# Patient Record
Sex: Male | Born: 1990 | Race: White | Hispanic: No | Marital: Single
Health system: Southern US, Community
[De-identification: ages and names within clinical notes are randomized; demographics above are authoritative.]

---

## 2021-11-04 ENCOUNTER — Emergency Department (HOSPITAL_COMMUNITY)
Admission: EM | Admit: 2021-11-04 | Discharge: 2021-11-04 | Disposition: A | Discharge status: Home / Self Care | Payer: No Typology Code available for payment source | Attending: Emergency Medicine | Admitting: Emergency Medicine

## 2021-11-04 ENCOUNTER — Emergency Department (HOSPITAL_COMMUNITY): Payer: No Typology Code available for payment source

## 2021-11-04 ENCOUNTER — Encounter (HOSPITAL_COMMUNITY): Payer: Self-pay

## 2021-11-04 ENCOUNTER — Other Ambulatory Visit: Payer: Self-pay

## 2021-11-04 DIAGNOSIS — Z20822 Contact with and (suspected) exposure to covid-19: Secondary | ICD-10-CM | POA: Insufficient documentation

## 2021-11-04 DIAGNOSIS — R059 Cough, unspecified: Secondary | ICD-10-CM | POA: Diagnosis present

## 2021-11-04 DIAGNOSIS — J029 Acute pharyngitis, unspecified: Secondary | ICD-10-CM

## 2021-11-04 DIAGNOSIS — J028 Acute pharyngitis due to other specified organisms: Secondary | ICD-10-CM | POA: Diagnosis not present

## 2021-11-04 DIAGNOSIS — B9789 Other viral agents as the cause of diseases classified elsewhere: Secondary | ICD-10-CM | POA: Diagnosis not present

## 2021-11-04 DIAGNOSIS — J069 Acute upper respiratory infection, unspecified: Secondary | ICD-10-CM | POA: Insufficient documentation

## 2021-11-04 LAB — RESP PANEL BY RT-PCR (FLU A&B, COVID) ARPGX2
Influenza A by PCR: NEGATIVE
Influenza B by PCR: NEGATIVE
SARS Coronavirus 2 by RT PCR: NEGATIVE

## 2021-11-04 LAB — GROUP A STREP BY PCR: Group A Strep by PCR: NOT DETECTED

## 2021-11-04 MED ORDER — IBUPROFEN 600 MG PO TABS: 600.0000 mg | ORAL_TABLET | Freq: Four times a day (QID) | ORAL | 0 refills | Status: DC | PRN

## 2021-11-04 NOTE — ED Notes (Signed)
I provided reinforced discharge education based off of discharge instructions. Pt acknowledged and understood my education. Pt had no further questions/concerns for provider/myself.  °

## 2021-11-04 NOTE — ED Provider Notes (Signed)
[facility]-EMERGENCY DEPT Provider Note   CSN: [id number] Arrival date & time: 11/04/21  0536     History  Chief Complaint  Patient presents with   Sore Throat   Cough    [facility] is a 31 y.o. male who presents to the ED complaining of sore throat and cough onset 6 days.  Denies sick contacts.  Has tried over-the-counter cough and cold medications with mild relief of his symptoms.  Denies chest pain, shortness of breath, trouble swallowing, fever, chills, nausea, vomiting. Patient is a he is in sober living and has been in [place] since 10/21/2021.  The history is provided by the patient. No language interpreter was used.      Home Medications Prior to Admission medications   Not on File      Allergies    Patient has no allergy information on record.    Review of Systems   Review of Systems  Constitutional:  Negative for chills and fever.  HENT:  Positive for congestion, rhinorrhea and sore throat. Negative for trouble swallowing.   Respiratory:  Positive for cough. Negative for shortness of breath.   Gastrointestinal:  Negative for nausea and vomiting.  All other systems reviewed and are negative.  Physical Exam Updated Vital Signs BP 115/90 (BP Location: Left Arm)    Pulse 69    Temp 98.7 F (37.1 C) (Oral)    Resp 17    Ht 5\' 9"  (1.753 m)    Wt 68 kg    SpO2 100%    BMI 22.15 kg/m  Physical Exam Vitals and nursing note reviewed.  Constitutional:      General: He is not in acute distress.    Appearance: He is not diaphoretic.  HENT:     Head: Normocephalic and atraumatic.     Mouth/Throat:     Mouth: Mucous membranes are moist.     Pharynx: Oropharynx is clear. Uvula midline. No pharyngeal swelling, oropharyngeal exudate, posterior oropharyngeal erythema or uvula swelling.     Tonsils: No tonsillar exudate or tonsillar abscesses.     Comments: Patent airway. Uvula midline without swelling. No posterior pharyngeal  erythema or exudate noted.  Eyes:     General: No scleral icterus.    Conjunctiva/sclera: Conjunctivae normal.  Cardiovascular:     Rate and Rhythm: Normal rate and regular rhythm.     Pulses: Normal pulses.     Heart sounds: Normal heart sounds.  Pulmonary:     Effort: Pulmonary effort is normal. No respiratory distress.     Breath sounds: Normal breath sounds. No wheezing.  Chest:     Chest wall: Tenderness present.     Comments: Right sided chest wall TTP. No obvious deformity noted. Abdominal:     General: Bowel sounds are normal.     Palpations: Abdomen is soft. There is no mass.     Tenderness: There is no abdominal tenderness. There is no guarding or rebound.  Musculoskeletal:        General: Normal range of motion.     Cervical back: Normal range of motion and neck supple.  Skin:    General: Skin is warm and dry.  Neurological:     Mental Status: He is alert.  Psychiatric:        Behavior: Behavior normal.    ED Results / Procedures / Treatments   Labs (all labs ordered are listed, but only abnormal results are displayed) Labs Reviewed  RESP PANEL BY  RT-PCR (FLU A&B, COVID) ARPGX2  GROUP A STREP BY PCR    EKG None  Radiology DG Chest 2 View  Result Date: 11/04/2021 CLINICAL DATA:  31 year old male with sore throat and cough for 5 days. EXAM: CHEST - 2 VIEW COMPARISON:  None. FINDINGS: Lung volumes are at the upper limits of normal. Normal cardiac size and mediastinal contours. Visualized tracheal air column is within normal limits. Lung markings are symmetric and within normal limits. Both lungs appear clear. No pneumothorax or pleural effusion. No osseous abnormality identified.  Negative visible bowel gas. IMPRESSION: Negative, no cardiopulmonary abnormality. Electronically Signed   By: Odessa Fleming M.D.   On: 11/04/2021 09:55    Procedures Procedures    Medications Ordered in ED Medications - No data to display  ED Course/ Medical Decision Making/  A&P Clinical Course as of 11/04/21 1013  Tue Nov 04, 2021  1003 Pt notified of negative chest xray finding in the ED. Discussed with patient that he can continue with OTC medications as previous. Sent Rx for ibuprofen. Pt agreeable with discharge treatment plan. Pt appears safe for discharge. [SB]    Clinical Course User Index [SB] Dijon Kohlman A, PA-C                           Medical Decision Making Amount and/or Complexity of Data Reviewed Radiology: ordered.  Risk Prescription drug management.   Patient with sore throat, rhinorrhea, nasal congestion, and cough x 6 days  Denies sick contacts. Has tried OTC medications with relief of his symptoms. Vital signs stable, pt afebrile, not tachycardic, or hypoxic. On exam, patient with patent airway, uvula midline without swelling. No tonsillar exudate. No acute cardiovascular, respiratory, or abdominal exam findings. Differential diagnosis includes COVID, flu, pneumonia, strep pharyngitis, viral URI with cough, viral pharyngitis.    Labs:  I ordered, and personally interpreted labs.  The pertinent results include:  COVID, flu, strep swab negative  Imaging: I ordered imaging studies including chest x-ray I independently visualized and interpreted imaging which showed no acute cardiopulmonary findings I agree with the radiologist interpretation   Disposition: Patient presentation suspicious for viral URI with cough as well as viral pharyngitis.  Doubt COVID, flu, pneumonia at this time.  After consideration of the diagnostic results and the patients response to treatment, I feel that the patient would benefit from discharge home with continuation of over-the-counter medications as needed for symptom management. Supportive care measures and strict return precautions discussed with patient at bedside. Pt acknowledges and verbalizes understanding. Pt appears safe for discharge. Follow up as indicated in discharge paperwork.    This chart  was dictated using voice recognition software, Dragon. Despite the best efforts of this provider to proofread and correct errors, errors may still occur which can change documentation meaning.   Final Clinical Impression(s) / ED Diagnoses Final diagnoses:  Viral URI with cough  Viral pharyngitis    Rx / DC Orders ED Discharge Orders     None         Jonda Alanis A, PA-C 11/04/21 1013    Pricilla Loveless, MD 11/05/21 [phone]

## 2021-11-04 NOTE — ED Triage Notes (Signed)
Pt reports with sore throat and cough since Thursday.

## 2021-11-04 NOTE — Discharge Instructions (Addendum)
It was a pleasure taking care of you!   Your COVID, flu, strep swabs were negative in the ED.  Your chest xray didn't show pneumonia in the ED. You may take over the counter 600 mg Ibuprofen every 6 hours or 500 mg Tylenol every 6 hours as needed for pain for no more than 7 days. Ensure to maintain fluid intake. Return to the Emergency Department if you are experiencing trouble breathing, worsening or increasing chest pain, decreased fluid intake or worsening symptoms.

## 2022-03-26 ENCOUNTER — Encounter (HOSPITAL_COMMUNITY): Payer: Self-pay | Admitting: Emergency Medicine

## 2022-03-26 ENCOUNTER — Emergency Department (HOSPITAL_COMMUNITY)
Admission: EM | Admit: 2022-03-26 | Discharge: 2022-03-26 | Disposition: A | Discharge status: Home / Self Care | Payer: No Typology Code available for payment source | Attending: Emergency Medicine | Admitting: Emergency Medicine

## 2022-03-26 DIAGNOSIS — R4589 Other symptoms and signs involving emotional state: Secondary | ICD-10-CM

## 2022-03-26 DIAGNOSIS — F32A Depression, unspecified: Secondary | ICD-10-CM | POA: Diagnosis present

## 2022-03-26 NOTE — ED Triage Notes (Signed)
Patient in today from home requesting a "mental health assessment". States that "I will tell the psychiatrist more when they come in".

## 2022-03-26 NOTE — ED Provider Notes (Signed)
[facility]-EMERGENCY DEPT Provider Note   CSN: [id number] Arrival date & time: 03/26/22  1646     History  Chief Complaint  Patient presents with   Mental Health Problem    Canden Cieslinski is a 31 y.o. male.   Mental Health Problem   Patient new to the area from [place] presents due to depressed mood.  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  Since then his mood has been depressed, he denies any suicidal ideations, homicidal ideations, paranoia, delusions.  He states he was on Seroquel in [place] but has not been on it for some time.  He is requesting a refill.  States he is here primarily for mood stabilizing medicine, does not have a primary care doctor in the area.  Denies any previous attempts at killing himself.  He is not use any substances in almost a week and checked into sober living again a week and a half ago.  Home Medications Prior to Admission medications   Medication Sig Start Date End Date Taking? Authorizing Provider  ibuprofen (ADVIL) 600 MG tablet Take 1 tablet (600 mg total) by mouth every 6 (six) hours as needed. 11/04/21   Blue, Soijett A, PA-C      Allergies    Patient has no allergy information on record.    Review of Systems   Review of Systems  Physical Exam Updated Vital Signs BP 128/73 (BP Location: Right Arm)   Pulse 80   Temp 98.6 F (37 C) (Oral)   Resp 16   Ht 5\' 8"  (1.727 m)   Wt 63.5 kg   SpO2 100%   BMI 21.29 kg/m  Physical Exam Vitals and nursing note reviewed. Exam conducted with a chaperone present.  Constitutional:      Appearance: Normal appearance.  HENT:     Head: Normocephalic and atraumatic.  Eyes:     General: No scleral icterus.       Right eye: No discharge.        Left eye: No discharge.     Extraocular Movements: Extraocular movements intact.     Pupils: Pupils are equal, round, and reactive to light.   Cardiovascular:     Rate and Rhythm: Normal rate and regular rhythm.     Pulses: Normal pulses.     Heart sounds: Normal heart sounds. No murmur heard.    No friction rub. No gallop.  Pulmonary:     Effort: Pulmonary effort is normal. No respiratory distress.     Breath sounds: Normal breath sounds.  Abdominal:     General: Abdomen is flat. Bowel sounds are normal. There is no distension.     Palpations: Abdomen is soft.     Tenderness: There is no abdominal tenderness.  Skin:    General: Skin is warm and dry.     Coloration: Skin is not jaundiced.  Neurological:     Mental Status: He is alert. Mental status is at baseline.     Coordination: Coordination normal.  Psychiatric:        Attention and Perception: Attention normal.        Mood and Affect: Mood normal. Affect is angry.        Speech: Speech normal.        Behavior: Behavior is agitated.        Thought Content: Thought content does not include suicidal ideation. Thought content  does not include homicidal or suicidal plan.        Cognition and Memory: Cognition normal.        Judgment: Judgment normal.     Comments: Speech is goal oriented.  Depressed mood but very clear he has no intent of hurting himself or others.     ED Results / Procedures / Treatments   Labs (all labs ordered are listed, but only abnormal results are displayed) Labs Reviewed - No data to display   EKG None  Radiology No results found.  Procedures Procedures    Medications Ordered in ED Medications - No data to display  ED Course/ Medical Decision Making/ A&P                           Medical Decision Making Amount and/or Complexity of Data Reviewed Labs: ordered.   Patient presents presents due to depressed mood.  He does not have any active suicidal or homicidal ideations.  He is goal oriented, currently already in sober living program.  I do not think he would meet IVC criteria.  I discussed with him the option of behavioral  health evaluation here medical work-up.  Patient states she does not want to stay for that has to go to work this evening to keep his arrangement of sober living.  He tells me he has no suicidal ideations or plan but feels depressed and is requesting refill of his Seroquel.  He is unsure about the dose, he has not been on it for some time.  Explained to the patient that I do not able to see any of his medical history and I would not be refilling any of his antipsychotic medicine here from the ED as it is not ethical and he needs follow-up while on mood altering medicines.  Patient is agitated, states "why can't you send my medicine and it's not even a narcotic?"  Patient states she does not wish to stay for TTS evaluation and he has refused labs twice.  I will provide patient with resources to establish with a primary care and also provided behavioral health resources and be hooked.  We discussed return precautions including suicidal ideation with plan, homicidal ideation or new or worsening symptoms.  Patient is discharged in stable condition.        Final Clinical Impression(s) / ED Diagnoses Final diagnoses:  Depressed mood    Rx / DC Orders ED Discharge Orders     None         Theron Arista, PA-C 03/26/22 2114    Rolan Bucco, MD 03/26/22 2210

## 2022-04-08 ENCOUNTER — Emergency Department (HOSPITAL_COMMUNITY)
Admission: EM | Admit: 2022-04-08 | Discharge: 2022-04-08 | Disposition: A | Discharge status: Home / Self Care | Payer: No Typology Code available for payment source | Attending: Emergency Medicine | Admitting: Emergency Medicine

## 2022-04-08 ENCOUNTER — Encounter (HOSPITAL_COMMUNITY): Payer: Self-pay

## 2022-04-08 ENCOUNTER — Other Ambulatory Visit: Payer: Self-pay

## 2022-04-08 DIAGNOSIS — K0889 Other specified disorders of teeth and supporting structures: Secondary | ICD-10-CM | POA: Diagnosis present

## 2022-04-08 DIAGNOSIS — K029 Dental caries, unspecified: Secondary | ICD-10-CM | POA: Diagnosis not present

## 2022-04-08 MED ORDER — IBUPROFEN 600 MG PO TABS
600.0000 mg | ORAL_TABLET | Freq: Four times a day (QID) | ORAL | 0 refills | Status: AC | PRN
Start: 2022-04-08 — End: ?

## 2022-04-08 MED ORDER — AMOXICILLIN 500 MG PO CAPS: 500.0000 mg | ORAL_CAPSULE | Freq: Three times a day (TID) | ORAL | 0 refills | Status: AC

## 2022-04-08 NOTE — ED Triage Notes (Signed)
PT reports he has been having dental pain x 2 days. States he is here to get a prescription for ibuprofen, some abx, and a work note.

## 2022-04-08 NOTE — ED Provider Notes (Signed)
[facility]-EMER[facility] DEPT Provider Note   CSN: [id number] Arrival date & time: 04/08/22  1749     History  Chief Complaint  Patient presents with   Dental Pain    [facility] is a 31 y.o. male.  The history is provided by the patient. No language interpreter was used.  Dental Pain   31 year old male presenting complaint of dental pain.  Reporting pain to L upper molar for the past 2 days as well as pain and swelling to the gum of R upper tooth for the past few days.  Pain is sharp and achy worse with chewing.  No fever no trouble swallowing no neck pain.  Does not have a dentist.  No other complaint.  Home Medications Prior to Admission medications   Medication Sig Start Date End Date Taking? Authorizing Provider  ibuprofen (ADVIL) 600 MG tablet Take 1 tablet (600 mg total) by mouth every 6 (six) hours as needed. 11/04/21   Blue, Soijett A, PA-C      Allergies    Patient has no known allergies.    Review of Systems   Review of Systems  All other systems reviewed and are negative.   Physical Exam Updated Vital Signs BP 115/71 (BP Location: Left Arm)   Pulse 88   Temp 98.6 F (37 C) (Oral)   Resp 16   Ht 5\' 9"  (1.753 m)   Wt 63.5 kg   SpO2 100%   BMI 20.67 kg/m  Physical Exam Vitals and nursing note reviewed.  Constitutional:      General: He is not in acute distress.    Appearance: He is well-developed.  HENT:     Head: Atraumatic.     Mouth/Throat:     Comments: Mouth: Significant dental decay noted to tooth #4 with adjacent gingival erythema and tenderness and mild swelling noted.  Dental decay noted to tooth #15 with tenderness.  No facial involvement. Eyes:     Conjunctiva/sclera: Conjunctivae normal.  Musculoskeletal:     Cervical back: Normal range of motion and neck supple.  Skin:    Findings: No rash.  Neurological:     Mental Status: He is alert.     ED Results / Procedures / Treatments   Labs (all labs  ordered are listed, but only abnormal results are displayed) Labs Reviewed - No data to display  EKG None  Radiology No results found.  Procedures Procedures    Medications Ordered in ED Medications - No data to display  ED Course/ Medical Decision Making/ A&P                           Medical Decision Making  BP 115/71 (BP Location: Left Arm)   Pulse 88   Temp 98.6 F (37 C) (Oral)   Resp 16   Ht 5\' 9"  (1.753 m)   Wt 63.5 kg   SpO2 100%   BMI 20.67 kg/m   Presentation consistent with dental pain of tooth # 4 and 15. No evidence of Ludwig's Angina, large abscess pocket, requirement for emergent extraction, or other complications. Provided prescription for penicillin 500 mg twice a day x 10 days. Provided prescription for NSAIDs and ABX. Patient informed to follow up with the dentist or local provider. Return to ER if pain uncontrolled, abscess that drains purulent fluid, high fevers, trouble swallowing, or other concerns.  Impression: Dental Pain   Plan:    * Discharge from  ED   * amox   * ibuprofen   * F/U with local dentist   * Informed to return to ED if has new or worsening symptoms. Expressed understanding of and agreement with plan and all questions answered.         Final Clinical Impression(s) / ED Diagnoses Final diagnoses:  Pain, dental    Rx / DC Orders ED Discharge Orders          Ordered    ibuprofen (ADVIL) 600 MG tablet  Every 6 hours PRN        04/08/22 1911    amoxicillin (AMOXIL) 500 MG capsule  3 times daily        04/08/22 1911              Fayrene Helper, PA-C 04/08/22 1912    Cheryll Cockayne, MD 04/14/22 [phone]

## 2022-04-13 ENCOUNTER — Encounter (HOSPITAL_COMMUNITY): Payer: Self-pay

## 2022-04-13 ENCOUNTER — Emergency Department (HOSPITAL_COMMUNITY)
Admission: EM | Admit: 2022-04-13 | Discharge: 2022-04-13 | Disposition: A | Discharge status: Home / Self Care | Payer: No Typology Code available for payment source | Attending: Emergency Medicine | Admitting: Emergency Medicine

## 2022-04-13 ENCOUNTER — Ambulatory Visit (HOSPITAL_COMMUNITY)
Admission: EM | Admit: 2022-04-13 | Discharge: 2022-04-13 | Disposition: A | Discharge status: Other Acute Inpatient Hospital | Payer: 59

## 2022-04-13 ENCOUNTER — Other Ambulatory Visit: Payer: Self-pay

## 2022-04-13 DIAGNOSIS — Z79899 Other long term (current) drug therapy: Secondary | ICD-10-CM | POA: Insufficient documentation

## 2022-04-13 DIAGNOSIS — R45851 Suicidal ideations: Secondary | ICD-10-CM

## 2022-04-13 DIAGNOSIS — Z20822 Contact with and (suspected) exposure to covid-19: Secondary | ICD-10-CM | POA: Insufficient documentation

## 2022-04-13 DIAGNOSIS — F1514 Other stimulant abuse with stimulant-induced mood disorder: Secondary | ICD-10-CM | POA: Insufficient documentation

## 2022-04-13 DIAGNOSIS — Z046 Encounter for general psychiatric examination, requested by authority: Secondary | ICD-10-CM | POA: Diagnosis present

## 2022-04-13 DIAGNOSIS — F1594 Other stimulant use, unspecified with stimulant-induced mood disorder: Secondary | ICD-10-CM

## 2022-04-13 DIAGNOSIS — F1510 Other stimulant abuse, uncomplicated: Secondary | ICD-10-CM

## 2022-04-13 LAB — RAPID URINE DRUG SCREEN, HOSP PERFORMED
Amphetamines: POSITIVE — AB
Barbiturates: NOT DETECTED
Benzodiazepines: NOT DETECTED
Cocaine: NOT DETECTED
Opiates: NOT DETECTED
Tetrahydrocannabinol: POSITIVE — AB

## 2022-04-13 LAB — CBC WITH DIFFERENTIAL/PLATELET
Abs Immature Granulocytes: 0.02 10*3/uL (ref 0.00–0.07)
Basophils Absolute: 0 10*3/uL (ref 0.0–0.1)
Basophils Relative: 1 %
Eosinophils Absolute: 0.3 10*3/uL (ref 0.0–0.5)
Eosinophils Relative: 4 %
HCT: 41.7 % (ref 39.0–52.0)
Hemoglobin: 13.6 g/dL (ref 13.0–17.0)
Immature Granulocytes: 0 %
Lymphocytes Relative: 36 %
Lymphs Abs: 2.3 10*3/uL (ref 0.7–4.0)
MCH: 30 pg (ref 26.0–34.0)
MCHC: 32.6 g/dL (ref 30.0–36.0)
MCV: 91.9 fL (ref 80.0–100.0)
Monocytes Absolute: 0.5 10*3/uL (ref 0.1–1.0)
Monocytes Relative: 8 %
Neutro Abs: 3.2 10*3/uL (ref 1.7–7.7)
Neutrophils Relative %: 51 %
Platelets: 224 10*3/uL (ref 150–400)
RBC: 4.54 MIL/uL (ref 4.22–5.81)
RDW: 13.1 % (ref 11.5–15.5)
WBC: 6.3 10*3/uL (ref 4.0–10.5)
nRBC: 0 % (ref 0.0–0.2)

## 2022-04-13 LAB — RESP PANEL BY RT-PCR (FLU A&B, COVID) ARPGX2
Influenza A by PCR: NEGATIVE
Influenza B by PCR: NEGATIVE
SARS Coronavirus 2 by RT PCR: NEGATIVE

## 2022-04-13 LAB — COMPREHENSIVE METABOLIC PANEL
ALT: 26 U/L (ref 0–44)
AST: 18 U/L (ref 15–41)
Albumin: 3.5 g/dL (ref 3.5–5.0)
Alkaline Phosphatase: 34 U/L — ABNORMAL LOW (ref 38–126)
Anion gap: 6 (ref 5–15)
BUN: 16 mg/dL (ref 6–20)
CO2: 26 mmol/L (ref 22–32)
Calcium: 8.7 mg/dL — ABNORMAL LOW (ref 8.9–10.3)
Chloride: 106 mmol/L (ref 98–111)
Creatinine, Ser: 0.72 mg/dL (ref 0.61–1.24)
GFR, Estimated: >60 mL/min (ref >60)
Glucose, Bld: 98 mg/dL (ref 70–99)
Potassium: 4.2 mmol/L (ref 3.5–5.1)
Sodium: 138 mmol/L (ref 135–145)
Total Bilirubin: 0.3 mg/dL (ref 0.3–1.2)
Total Protein: 6.2 g/dL — ABNORMAL LOW (ref 6.5–8.1)

## 2022-04-13 LAB — SALICYLATE LEVEL: Salicylate Lvl: <7 mg/dL — ABNORMAL LOW (ref 7.0–30.0)

## 2022-04-13 LAB — ACETAMINOPHEN LEVEL: Acetaminophen (Tylenol), Serum: <10 ug/mL — ABNORMAL LOW (ref 10–30)

## 2022-04-13 LAB — ETHANOL: Alcohol, Ethyl (B): <10 mg/dL (ref <10)

## 2022-04-13 MED ORDER — ALUM & MAG HYDROXIDE-SIMETH 200-200-20 MG/5ML PO SUSP: 30.0000 mL | Freq: Four times a day (QID) | ORAL | Status: DC | PRN

## 2022-04-13 MED ORDER — NICOTINE 21 MG/24HR TD PT24: 21.0000 mg | MEDICATED_PATCH | Freq: Every day | TRANSDERMAL | Status: DC

## 2022-04-13 MED ORDER — ONDANSETRON HCL 4 MG PO TABS: 4.0000 mg | ORAL_TABLET | Freq: Three times a day (TID) | ORAL | Status: DC | PRN

## 2022-04-13 MED ORDER — IBUPROFEN 200 MG PO TABS: 600.0000 mg | ORAL_TABLET | Freq: Three times a day (TID) | ORAL | Status: DC | PRN

## 2022-04-13 NOTE — Progress Notes (Signed)
   04/13/22 0335  [facility] Triage Screening (Walk-ins at [facility] only)  What Is the Reason for Your Visit/Call Today? Pt reports he feels "down and depressed." He states he came from [place] to 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 He reports current suicidal ideation with plan to walk into traffic. He denies current homicidal ideation or psychotic symptoms.  How Long Has This Been Causing You Problems? 1 wk - 1 month  Have You Recently Had Any Thoughts About Hurting Yourself? Yes  How long ago did you have thoughts about hurting yourself? Today  Are You Planning to Commit Suicide/Harm Yourself At This time? Yes  Have you Recently Had Thoughts About Hurting Someone [name]? No  Are You Planning To Harm Someone At This Time? No  Are you currently experiencing any auditory, visual or other hallucinations? No  Have You Used Any Alcohol or Drugs in the Past 24 Hours? No  Do you have any current medical co-morbidities that require immediate attention? No  Clinician description of patient physical appearance/behavior: Pt is casually dressed, alert and oriented x4. Pt speaks in a clear tone, at moderate volume and normal pace. Motor behavior appears normal. Eye contact is good. Pt's mood is depressed and affect appears euthymic. Thought process is coherent and relevant. There is no indication Pt is currently responding to internal stimuli or experiencing delusional thought content.  What Do You Feel Would Help You the Most Today? Alcohol or Drug Use Treatment;Treatment for Depression or other mood problem;Medication(s)  If access to [facility] was not available, would you have sought care in the Emergency Department? Yes  Determination of Need Urgent (48 hours)  Options For Referral Medication Management;BH Urgent Care;Outpatient Therapy

## 2022-04-13 NOTE — ED Triage Notes (Signed)
Sent from [facility] for suicidal ideation. Pt admits a plan to step out in traffic or use of a rope. Denies availability to firearms.   Thoughts have been persistent for several mont hs. Requesting to speak with psychiatry.

## 2022-04-13 NOTE — Discharge Instructions (Addendum)
To help you maintain a sober lifestyle, a substance abuse treatment program may be beneficial to you.  Contact one of the following providers at your earliest opportunity to ask about enrolling their program:  MEDICAL DETOX AND RESIDENTIAL TREATMENT:         [facility]      [address]       [place], [place] [address]       [phone]         [facility]      [address], [place] [address]       [phone]         [facility]      [address]       [place], [place] [address]       [phone]   OUTPATIENT PROGRAMS:        [facility]      [address]      [place], [place] [address]      [phone]       New patients are seen at their walk-in clinic.  Walk-in hours are Monday - Friday from 8:30 am - 12:00 pm, and from 1:00 pm - 2:30 pm.  Walk-in patients are seen on a first come, first served basis, so try to arrive as early as possible for the best chance of being seen the same day.       [facility]      [address] [place], [place] [address]      [phone]    Discharge recommendations:  Patient is to take medications as prescribed. Please see information for follow-up appointment with psychiatry and therapy. Please follow up with your primary care provider for all medical related needs.   Therapy: We recommend that patient participate in individual therapy to address mental health concerns.  Safety:  The patient should abstain from use of illicit substances/drugs and abuse of any medications. If symptoms worsen or do not continue to improve or if the patient becomes actively suicidal or homicidal then it is recommended that the patient return to the closest hospital emergency department, the [facility], or call 911 for further evaluation and treatment. National Suicide Prevention Lifeline 1-800-SUICIDE or 610 157 9314.  About 988 988 offers 24/7 access  to trained crisis counselors who can help people experiencing mental health-related distress. People can call or text 988 or chat 988lifeline.org for themselves or if they are worried about a loved one who may need crisis support.

## 2022-04-13 NOTE — BH Assessment (Signed)
[facility] Assessment Progress Note   Per Nira Conn, NP, this voluntary pt does not require psychiatric hospitalization at this time.  Pt is psychiatrically cleared.  Discharge instructions include referrals for several residential and outpatient substance use disorder treatment providers.  EDP Pricilla Loveless, MD and pt's nurse, Destiny, have been notified.  Doylene Canning, MA Triage Specialist [phone]

## 2022-04-13 NOTE — ED Notes (Signed)
Pt received lunch tray 

## 2022-04-13 NOTE — ED Provider Notes (Signed)
[facility] Medical Screening Exam  Patient Name: Travis Moran MRN: [id number] Date of Evaluation: 04/13/22 Chief Complaint:   Diagnosis:  Final diagnoses:  Methamphetamine abuse (HCC)  Suicidal ideation    History of Present illness: Travis Moran is a 31 y.o. male with history depression and methamphetamine abuse. Patient presented voluntarily to [facility] reporting suicidal ideation with plan to "run into traffic."  Patient is calm, alert, and oriented x4. He has normal speech, behavior, and eye contact. Patient's mood is depressed with congruent affect. His thought process is coherent. No evidence of preoccupation, mania, psychosis noted.   Patient says he recently moved from [place] and that he was residing at [facility] [place] but was discharged last night after failing a random drug test. He says he relapsed on methamphetamine a few weeks ago. He reports using 1 gram of meth per month. He said he was clean for approximately 1 year prior to recent relapse. He says his relapsed was triggered by him thinking about his ex-girlfriend. He says they brook up 10 years ago when he was a teenager. He says he is unable to contract for safety and has plan to kill himself by running into traffic. He denies homicidal ideation, paranoia, and hallucination.   Supportive therapy provided about ongoing stressors. Discussed crisis plan, callling [id number] with patient however patient continues to endorse suicidal ideation and voiced that he is unable to maintain his safety outside of hospital setting. At this time, there are no available beds at [facility], patient will be transfer to [facility] for continuous assessment with follow-up by psychiatric.    Psychiatric Specialty Exam  Presentation  General Appearance:Casual  Eye Contact:Good  Speech:Clear and Coherent  Speech Volume:Normal  Handedness:Right   Mood and Affect   Mood:Depressed  Affect:Congruent   Thought Process  Thought Processes:Coherent  Descriptions of Associations:Intact  Orientation:Full (Time, Place and Person)  Thought Content:WDL  Diagnosis of Schizophrenia or Schizoaffective disorder in past: No   Hallucinations:None  Ideas of Reference:None  Suicidal Thoughts:No  Homicidal Thoughts:No   Sensorium  Memory:Immediate Good; Recent Good; Remote Good  Judgment:Fair  Insight:Good   Executive Functions  Concentration:Good  Attention Span:Good  Recall:Good  Fund of Knowledge:Good  Language:Good   Psychomotor Activity  Psychomotor Activity:Normal   Assets  Assets:Communication Skills; Desire for Improvement; Physical Health   Sleep  Sleep:Fair  Number of hours: 5   No data recorded  Physical Exam: Physical Exam Vitals and nursing note reviewed.  Constitutional:      General: He is not in acute distress.    Appearance: He is well-developed.  HENT:     Head: Normocephalic and atraumatic.  Eyes:     Conjunctiva/sclera: Conjunctivae normal.  Cardiovascular:     Rate and Rhythm: Normal rate.  Pulmonary:     Effort: Pulmonary effort is normal. No respiratory distress.     Breath sounds: Normal breath sounds.  Abdominal:     Palpations: Abdomen is soft.     Tenderness: There is no abdominal tenderness.  Musculoskeletal:        General: Normal range of motion.     Cervical back: Neck supple.  Skin:    General: Skin is warm.  Neurological:     Mental Status: He is alert and oriented to person, place, and time.  Psychiatric:        Attention and Perception: Attention and perception normal.        Mood and Affect: Mood is depressed.  Speech: Speech normal.        Behavior: Behavior normal. Behavior is cooperative.        Thought Content: Thought content is not paranoid or delusional. Thought content includes suicidal ideation. Thought content does not include homicidal ideation. Thought  content includes suicidal plan. Thought content does not include homicidal plan.    Review of Systems  Constitutional: Negative.   HENT: Negative.    Eyes: Negative.   Respiratory: Negative.    Cardiovascular: Negative.   Gastrointestinal: Negative.   Genitourinary: Negative.   Musculoskeletal: Negative.   Skin: Negative.   Neurological: Negative.   Endo/Heme/Allergies: Negative.   Psychiatric/Behavioral:  Positive for depression, substance abuse and suicidal ideas.    Blood pressure 104/68, pulse 85, temperature 98.4 F (36.9 C), resp. rate 18, SpO2 99 %. There is no height or weight on file to calculate BMI.  Musculoskeletal: Strength & Muscle Tone: within normal limits Gait & Station: normal Patient leans: Right   [facility] MSE Discharge Disposition for Follow up and Recommendations: Supportive therapy provided about ongoing stressors. Discussed crisis plan, callling [id number]/988 with patient however patient continues to endorse suicidal ideation and voiced that he is unable to maintain his safety outside of hospital setting. At this time, there are no available beds at [facility], patient will be transfer to [facility] for continuous assessment with follow-up by psychiatric.    Maricela Bo, NP 04/13/2022, 5:09 AM

## 2022-04-13 NOTE — Discharge Summary (Signed)
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 Caucasian male presenting voluntarily from [facility] on 04/13/2022 for SI with a plan to "run into traffic." Pt has a PMHx of depression and methamphetamine abuse. Upon assessment, pt continues to endorse suicidal ideations but without a plan. Pt reports that he has been suicidal for a couple of weeks now. He denies any prior suicide attempts or engaging in any self-injurious behaviors. Pt reports that he is originally from [place] and recently moved down to [place]. He was staying at [facility] [place] until he relapsed on methamphetamines. He was discharged from [facility] because he failed his random drug test last night.    Reports that he has been using meth, but was clean til the beginning of July. He started his methamphetamine use "years ago." Reports THC use "here and there." Reports that his ETOH use is rare. UDS on 04/13/2022 is + for amphetamines and THC. ETOH is less than 10.    NP note from [facility] also states "He says he relapsed on methamphetamine a few weeks ago. He reports using 1 gram of meth per month. He said he was clean for approximately 1 year prior to recent relapse. He says his relapsed was triggered by him thinking about his ex-girlfriend. He says they broke up 10 years ago when he was a teenager."   Reports being inpatient at a psychiatric hospital when he was younger. Currently denies taking any medications. Prior hx of psychotropic medications include Seroquel and ADHD medications. Pt has had 2 other ED visits this month. 03/26/2022 for a depressed mood and he was requesting a refill on his Seroquel at  that time and 04/08/2022 for dental pain.  On evaluation patient is alert and oriented x 4, pleasant, and cooperative. Speech is clear and coherent. Mood is depressed and affect is congruent with mood. Thought process is coherent and thought content is logical. Denies auditory and visual hallucinations. No indication that patient is responding to internal stimuli. No evidence of delusional thought content. Endorses suicidal ideations without intent or plan. Denies homicidal ideations. Requests resources for counseling.   ED Assessment Time Calculation: Start Time: 0930 Stop Time: 0945 Total Time in Minutes (Assessment Completion): 15   Past Psychiatric History: ADHD, inpatient treatment when younger, methamphetamine use disorder  Past Medical History: History reviewed. No pertinent past medical history. History reviewed. No pertinent surgical history. Family History: No family history on file. Family Psychiatric  History: denies Social History:  Social History   Substance and Sexual Activity  Alcohol Use Not Currently     Social History   Substance and Sexual Activity  Drug Use Not Currently    Social History   Socioeconomic History   Marital status: Single    Spouse name: Not on file   Number of children: Not on file   Years of education: Not on file   Highest education level: Not on file  Occupational History   Not on file  Tobacco Use   Smoking status: Unknown   Smokeless tobacco: Not on file  Substance and Sexual Activity   Alcohol use: Not Currently   Drug use: Not Currently   Sexual activity: Not on file  Other Topics Concern  Not on file  Social History Narrative   Not on file   Social Determinants of Health   Financial Resource Strain: Not on file  Food Insecurity: Not on file  Transportation Needs: Not on file  Physical Activity: Not on file  Stress: Not on file  Social Connections: Not on file    Tobacco Cessation:  A prescription for an  FDA-approved tobacco cessation medication was offered at discharge and the patient refused  Current Medications: Current Facility-Administered Medications  Medication Dose Route Frequency Provider Last Rate Last Admin   alum & mag hydroxide-simeth (MAALOX/MYLANTA) 200-200-20 MG/5ML suspension 30 mL  30 mL Oral Q6H PRN Pricilla Loveless, MD       ibuprofen (ADVIL) tablet 600 mg  600 mg Oral Q8H PRN Pricilla Loveless, MD       nicotine (NICODERM CQ - dosed in mg/24 hours) patch 21 mg  21 mg Transdermal Daily Pricilla Loveless, MD       ondansetron (ZOFRAN) tablet 4 mg  4 mg Oral Q8H PRN Pricilla Loveless, MD       Current Outpatient Medications  Medication Sig Dispense Refill   amoxicillin (AMOXIL) 500 MG capsule Take 1 capsule (500 mg total) by mouth 3 (three) times daily. 21 capsule 0   ibuprofen (ADVIL) 600 MG tablet Take 1 tablet (600 mg total) by mouth every 6 (six) hours as needed. (Patient taking differently: Take 600 mg by mouth every 6 (six) hours as needed (pain).) 20 tablet 0   PTA Medications: (Not in a hospital admission)   [place] Scale:  Flowsheet Row ED from 04/13/2022 in [facility] Most recent reading at 04/13/2022  6:53 AM ED from 04/13/2022 in [facility] Most recent reading at 04/13/2022  4:27 AM ED from 04/08/2022 in [facility] Most recent reading at 04/08/2022  6:23 PM  C-SSRS RISK CATEGORY High Risk Moderate Risk No Risk       Musculoskeletal: Strength & Muscle Tone: within normal limits Gait & Station: normal Patient leans: N/A  Psychiatric Specialty Exam: Presentation  General Appearance: Casual  Eye Contact:Good  Speech:Clear and Coherent; Normal Rate  Speech Volume:Normal  Handedness:Right   Mood and Affect  Mood:Depressed  Affect:Congruent   Thought Process  Thought Processes:Coherent; Goal Directed; Linear  Descriptions of  Associations:Intact  Orientation:Full (Time, Place and Person)  Thought Content:Logical  History of Schizophrenia/Schizoaffective disorder:No  Duration of Psychotic Symptoms:No data recorded Hallucinations:Hallucinations: None  Ideas of Reference:None  Suicidal Thoughts:Suicidal Thoughts: Yes, Passive SI Active Intent and/or Plan: Without Intent; Without Plan  Homicidal Thoughts:Homicidal Thoughts: No   Sensorium  Memory:Immediate Good; Recent Good; Remote Good  Judgment:Fair  Insight:Good   Executive Functions  Concentration:Good  Attention Span:Good  Recall:Good  Fund of Knowledge:Good  Language:Good   Psychomotor Activity  Psychomotor Activity:Psychomotor Activity: Normal   Assets  Assets:Communication Skills; Desire for Improvement; Physical Health   Sleep  Sleep:Sleep: Fair Number of Hours of Sleep: 5    Physical Exam: Physical Exam Constitutional:      General: He is not in acute distress.    Appearance: He is not ill-appearing, toxic-appearing or diaphoretic.  HENT:     Right Ear: External ear normal.     Left Ear: External ear normal.  Eyes:     Pupils: Pupils are equal, round, and reactive to light.  Cardiovascular:     Rate and Rhythm: Normal rate.  Pulmonary:     Effort: Pulmonary effort is normal. No respiratory  distress.  Musculoskeletal:        General: Normal range of motion.  Neurological:     Mental Status: He is alert and oriented to person, place, and time.  Psychiatric:        Thought Content: Thought content is not paranoid or delusional. Thought content includes suicidal ideation. Thought content does not include homicidal ideation. Thought content does not include suicidal plan.    Review of Systems  Constitutional:  Negative for chills, diaphoresis, fever, malaise/fatigue and weight loss.  Cardiovascular:  Negative for chest pain and palpitations.  Gastrointestinal:  Negative for diarrhea, nausea and vomiting.   Neurological:  Negative for dizziness and seizures.  Psychiatric/Behavioral:  Positive for depression, substance abuse and suicidal ideas. Negative for hallucinations and memory loss. The patient is nervous/anxious and has insomnia.    Blood pressure 96/60, pulse 76, temperature 97.6 F (36.4 C), temperature source Oral, resp. rate 16, height 5\' 9"  (1.753 m), weight 63.5 kg, SpO2 100 %. Body mass index is 20.67 kg/m.   Demographic Factors:  Male, Caucasian, and Low socioeconomic status  Loss Factors: Financial problems/change in socioeconomic status  Historical Factors: NA  Risk Reduction Factors:   Employed  Continued Clinical Symptoms:  Alcohol/Substance Abuse/Dependencies  Cognitive Features That Contribute To Risk:  None    Suicide Risk:  Minimal: No identifiable suicidal ideation.  Patients presenting with no risk factors but with morbid ruminations; may be classified as minimal risk based on the severity of the depressive symptoms   Medical Decision Making: Patient denies suicidal intent or plan. Requests resources for counseling.  Problem 1: Methamphetamine induced mood disorder    Disposition: No evidence of imminent risk to self or others at present.   Patient does not meet criteria for psychiatric inpatient admission. Supportive therapy provided about ongoing stressors. Discussed crisis plan, support from social network, calling 911, coming to the Emergency Department, and calling Suicide Hotline. Provided with outpatient resources.    , NP 04/13/2022, 12:05 PM

## 2022-04-13 NOTE — ED Notes (Signed)
[facility] called for patient transfer.

## 2022-04-13 NOTE — Progress Notes (Addendum)
TOC CSW has attached shelter resource to pt's AVS.  Adden  1:50pm Pt was requesting ride to [place], the hospital only provides local bus passes. CSW provided pt with three [employer] bus passes for pt d/c.   Valentina Shaggy.Dusty Raczkowski, MSW, LCSWA [facility]  Transitions of Care Clinical Social Worker I Direct Dial: [phone]  Fax: [phone] [email or web address]

## 2022-04-13 NOTE — BH Assessment (Signed)
Comprehensive Clinical Assessment (CCA) Note  04/13/2022 Travis Moran [id number]  DISPOSITION: Gave clinical report to Travis Asper, NP who completed MSE and recommended Pt be transferred to [facility] for observation due to [facility] being at capacity. Pt will be re-evaluated later today.  The patient demonstrates the following risk factors for suicide: Chronic risk factors for suicide include: psychiatric disorder of mood disorder and substance use disorder. Acute risk factors for suicide include: social withdrawal/isolation and loss (financial, interpersonal, professional). Protective factors for this patient include: hope for the future. Considering these factors, the overall suicide risk at this point appears to be moderate. Patient is appropriate for outpatient follow up.  Flowsheet Row ED from 04/13/2022 in [facility] ED from 04/08/2022 in [facility] from 03/26/2022 in [facility]  C-SSRS RISK CATEGORY Moderate Risk No Risk No Risk      Pt is a 31 year old single male who presents unaccompanied to [facility] reporting depressive symptoms, methamphetamine use, and suicidal ideation. Pt says he is from [place] and came to [place] to participate in substance abuse treatment. Pt says he has been staying at [facility] but returned to using methamphetamines at the beginning of the month. He says today he was told he had to leave [facility] and was referred to [facility]. Pt says he feels "down and depressed." He reports current suicidal ideation with plan to walk into traffic. He denies history of suicide attempts. He acknowledges symptoms including social withdrawal and feelings of guilt, worthlessness and hopelessness. He denies problems with sleep. He describe his appetite as inconsistent, that some days he eats and others he has no appetite. Pt denies any history of intentional self-injurious behaviors.  Pt denies current homicidal ideation or history of violence. Pt denies any history of auditory or visual hallucinations. He denies delusional thought content. Pt reports ingesting methamphetamines and denies use of other substances.  Pt says he is currently homeless. 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 He states he wants to return to [place] when he has enough money for a bus ticket. He identifies thinking about his ex-girlfriend as a trigger for substance use. Pt says he has been psychiatrically hospitalized in [place] at least a dozen times, usually after being petitioned for IVC due to substance use. He says he has no outpatient providers and is not taking any psychiatric medications. He denies history of abuse or trauma. He denies current legal problems. He denies access to firearms.   Pt is casually dressed, alert and oriented x4. Pt speaks in a clear tone, at moderate volume and normal pace. Motor behavior appears normal. Eye contact is good. Pt describes his mood as depressed but affect is euthymic. Thought process is coherent and relevant. There is no indication Pt is currently responding to internal stimuli or experiencing delusional thought content. He is calm and cooperative.  Chief Complaint: No chief complaint on file.  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 Other (Comment) ([facility])  What Is the Reason for Your Visit/Call Today? Pt reports he feels "down and depressed." He states he came from [place] to 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 He reports current suicidal ideation with plan to walk into traffic. He  denies current homicidal ideation or psychotic symptoms.  How Long Has This Been Causing You Problems?  1 wk - 1 month  What Do You Feel Would Help You the Most Today? Alcohol or Drug Use Treatment; Treatment for Depression or other mood problem; Medication(s)   Have You Recently Had Any Thoughts About Hurting Yourself? Yes  Are You Planning to Commit Suicide/Harm Yourself At This time? Yes   Have you Recently Had Thoughts About Hurting Someone Travis Moran? No  Are You Planning to Harm Someone at This Time? No  Explanation: No data recorded  Have You Used Any Alcohol or Drugs in the Past 24 Hours? No  How Long Ago Did You Use Drugs or Alcohol? No data recorded What Did You Use and How Much? No data recorded  Do You Currently Have a Therapist/Psychiatrist? No  Name of Therapist/Psychiatrist: No data recorded  Have You Been Recently Discharged From Any Office Practice or Programs? Yes  Explanation of Discharge From Practice/Program: Had to leave [facility] today     CCA Screening Triage Referral Assessment Type of Contact: Face-to-Face  Telemedicine Service Delivery:   Is this Initial or Reassessment? No data recorded Date Telepsych consult ordered in [facility]:  No data recorded Time Telepsych consult ordered in [facility]:  No data recorded Location of Assessment: [facility] Services  Provider Location: [facility] Assessment Services   Collateral Involvement: None   Does Patient Have a Automotive engineer Guardian? No data recorded Name and Contact of Legal Guardian: No data recorded If Minor and Not Living with Parent(s), Who has Custody? NA  Is CPS involved or ever been involved? Never  Is APS involved or ever been involved? Never   Patient Determined To Be At Risk for Harm To Self or Others Based on Review of Patient Reported Information or Presenting Complaint? Yes, for Self-Harm  Method: No data recorded Availability of Means: No data recorded Intent: No data recorded Notification Required: No data recorded Additional Information for Danger to Others  Potential: No data recorded Additional Comments for Danger to Others Potential: No data recorded Are There Guns or Other Weapons in Your Home? No data recorded Types of Guns/Weapons: No data recorded Are These Weapons Safely Secured?                            No data recorded Who Could Verify You Are Able To Have These Secured: No data recorded Do You Have any Outstanding Charges, Pending Court Dates, Parole/Probation? No data recorded Contacted To Inform of Risk of Harm To Self or Others: Unable to Contact:    Does Patient Present under Involuntary Commitment? No  IVC Papers Initial File Date: No data recorded  [place] of Residence: [place]   Patient Currently Receiving the Following Services: Not Receiving Services   Determination of Need: Urgent (48 hours)   Options For Referral: [facility]; Medication Management; Outpatient Therapy     CCA Biopsychosocial Patient Reported Schizophrenia/Schizoaffective Diagnosis in Past: No   Strengths: Pt articulates his needs.   Mental Health Symptoms Depression:   Hopelessness; Worthlessness   Duration of Depressive symptoms:  Duration of Depressive Symptoms: Greater than two weeks   Mania:   None   Anxiety:    None   Psychosis:   None   Duration of Psychotic symptoms:    Trauma:   None   Obsessions:   None   Compulsions:   None  Inattention:   N/A   Hyperactivity/Impulsivity:   N/A   Oppositional/Defiant Behaviors:   N/A   Emotional Irregularity:   None   Other Mood/Personality Symptoms:   NA    Mental Status Exam Appearance and self-care  Stature:   Average   Weight:   Thin   Clothing:   Casual   Grooming:   Neglected   Cosmetic use:   None   Posture/gait:   Normal   Motor activity:   Not Remarkable   Sensorium  Attention:   Normal   Concentration:   Normal   Orientation:   X5   Recall/memory:   Normal   Affect and Mood  Affect:   Appropriate   Mood:    Depressed   Relating  Eye contact:   Normal   Facial expression:   Responsive   Attitude toward examiner:   Cooperative   Thought and Language  Speech flow:  Normal   Thought content:   Appropriate to Mood and Circumstances   Preoccupation:   None   Hallucinations:   None   Organization:  No data recorded  [employer] of Knowledge:   Average   Intelligence:   Average   Abstraction:   Normal   Judgement:   Fair   Dance movement psychotherapist:   Realistic   Insight:   Fair   Decision Making:   Normal   Social Functioning  Social Maturity:   Isolates   Social Judgement:   "Chief of Staff"   Stress  Stressors:   Housing; Surveyor, quantity; Transitions   Coping Ability:   Normal   Skill Deficits:   None   Supports:   Support needed     Religion: Religion/Spirituality Are You A Religious Person?: No How Might This Affect Treatment?: NA  Leisure/Recreation: Leisure / Recreation Do You Have Hobbies?: Yes Leisure and Hobbies: Production manager on [employer]  Exercise/Diet: Exercise/Diet Do You Exercise?: No Have You Gained or Lost A Significant Amount of Weight in the Past Six Months?: No Do You Follow a Special Diet?: No Do You Have Any Trouble Sleeping?: No   CCA Employment/Education Employment/Work Situation: Employment / Work Situation Employment Situation: Employed Work Stressors: None Patient's Job has Been Impacted by Current Illness: No Has Patient ever Been in Equities trader?: No  Education: Education Is Patient Currently Attending School?: No Last Grade Completed: 12 Did You Product manager?: No Did You Have An Individualized Education Program (IIEP): No Did You Have Any Difficulty At [employer]?: No Patient's Education Has Been Impacted by Current Illness: No   CCA Family/Childhood History Family and Relationship History: Family history Marital status: Single Does patient have children?: No  Childhood History:  Childhood  History By whom was/is the patient raised?: Both parents Did patient suffer any verbal/emotional/physical/sexual abuse as a child?: No Did patient suffer from severe childhood neglect?: No Has patient ever been sexually abused/assaulted/raped as an adolescent or adult?: No Was the patient ever a victim of a crime or a disaster?: No Witnessed domestic violence?: No Has patient been affected by domestic violence as an adult?: No  Child/Adolescent Assessment:     CCA Substance Use Alcohol/Drug Use: Alcohol / Drug Use Pain Medications: Denies abuse Prescriptions: Denies abuse Over the Counter: Denies abuse History of alcohol / drug use?: Yes Longest period of sobriety (when/how long): 1 year Negative Consequences of Use: Financial, Personal relationships Withdrawal Symptoms: None Substance #1 Name of Substance 1: Methamphetamines 1 - Age of First Use: 19 1 - Amount (  size/oz): "I use about 1 gram in a month" 1 - Frequency: Varies 1 - Duration: Ongoing 1 - Last Use / Amount: 04/12/2022 1 - Method of Aquiring: unknown 1- Route of Use: Oral ingestion                       ASAM's:  Six Dimensions of Multidimensional Assessment  Dimension 1:  Acute Intoxication and/or Withdrawal Potential:   Dimension 1:  Description of individual's past and current experiences of substance use and withdrawal: Pt reports a long history of methamphetamine use  Dimension 2:  Biomedical Conditions and Complications:   Dimension 2:  Description of patient's biomedical conditions and  complications: None  Dimension 3:  Emotional, Behavioral, or Cognitive Conditions and Complications:  Dimension 3:  Description of emotional, behavioral, or cognitive conditions and complications: Pt reports history of mental health symptoms  Dimension 4:  Readiness to Change:  Dimension 4:  Description of Readiness to Change criteria: Pt says he wants treatment  Dimension 5:  Relapse, Continued use, or Continued  Problem Potential:  Dimension 5:  Relapse, continued use, or continued problem potential critiera description: Pt has had one year of sobriety  Dimension 6:  Recovery/Living Environment:  Dimension 6:  Recovery/Iiving environment criteria description: Was staying at [employer], homeless at the moment.  ASAM Severity Score: ASAM's Severity Rating Score: 8  ASAM Recommended Level of Treatment: ASAM Recommended Level of Treatment: Level III Residential Treatment   Substance use Disorder (SUD) Substance Use Disorder (SUD)  Checklist Symptoms of Substance Use: Continued use despite having a persistent/recurrent physical/psychological problem caused/exacerbated by use, Continued use despite persistent or recurrent social, interpersonal problems, caused or exacerbated by use, Persistent desire or unsuccessful efforts to cut down or control use, Presence of craving or strong urge to use, Social, occupational, recreational activities given up or reduced due to use, Substance(s) often taken in larger amounts or over longer times than was intended  Recommendations for Services/Supports/Treatments: Recommendations for Services/Supports/Treatments Recommendations For Services/Supports/Treatments: Residential-Level 1  Discharge Disposition: Discharge Disposition Medical Exam completed: Yes  DSM5 Diagnoses: There are no problems to display for this patient.    Referrals to Alternative Service(s): Referred to Alternative Service(s):   Place:   Date:   Time:    Referred to Alternative Service(s):   Place:   Date:   Time:    Referred to Alternative Service(s):   Place:   Date:   Time:    Referred to Alternative Service(s):   Place:   Date:   Time:     Pamalee Leyden, [facility]

## 2022-04-13 NOTE — ED Provider Notes (Signed)
[facility] Provider Note   CSN: [id number] Arrival date & time: 04/13/22  [id number]     History  Chief Complaint  Patient presents with   Suicidal    Travis Moran is a 31 y.o. male.  HPI 31 year old male presents with suicidal thoughts.  This has been ongoing for months.  He went to [facility] and was sent here as they were at capacity.  It seems according to notes he was unable to contract for safety.  Patient tells me he has had suicidal ideation but no plan.  He is not sure why he sought care today.  He denies any medical complaints or pain.  He states earlier in the month he relapsed on methamphetamines but has not used any drugs since.  No smoking or alcohol use. No previous suicide attempts.  Home Medications Prior to Admission medications   Medication Sig Start Date End Date Taking? Authorizing Provider  amoxicillin (AMOXIL) 500 MG capsule Take 1 capsule (500 mg total) by mouth 3 (three) times daily. 04/08/22  Yes Fayrene Helper, PA-C  ibuprofen (ADVIL) 600 MG tablet Take 1 tablet (600 mg total) by mouth every 6 (six) hours as needed. Patient taking differently: Take 600 mg by mouth every 6 (six) hours as needed (pain). 04/08/22  Yes Fayrene Helper, PA-C      Allergies    Patient has no known allergies.    Review of Systems   Review of Systems  Cardiovascular:  Negative for chest pain.  Neurological:  Negative for headaches.  Psychiatric/Behavioral:  Positive for suicidal ideas.     Physical Exam Updated Vital Signs BP (!) 97/55 (BP Location: Left Arm)   Pulse 63   Temp 98 F (36.7 C) (Oral)   Resp 16   Ht 5\' 9"  (1.753 m)   Wt 63.5 kg   SpO2 96%   BMI 20.67 kg/m  Physical Exam Vitals and nursing note reviewed.  Constitutional:      Appearance: He is well-developed.  HENT:     Head: Normocephalic and atraumatic.  Cardiovascular:     Rate and Rhythm: Normal rate and regular rhythm.     Heart sounds: Normal heart sounds.   Pulmonary:     Effort: Pulmonary effort is normal.     Breath sounds: Normal breath sounds.  Abdominal:     Palpations: Abdomen is soft.     Tenderness: There is no abdominal tenderness.  Skin:    General: Skin is warm and dry.  Neurological:     Mental Status: He is alert.  Psychiatric:        Thought Content: Thought content includes suicidal ideation. Thought content does not include suicidal plan.     ED Results / Procedures / Treatments   Labs (all labs ordered are listed, but only abnormal results are displayed) Labs Reviewed  COMPREHENSIVE METABOLIC PANEL - Abnormal; Notable for the following components:      Result Value   Calcium 8.7 (*)    Total Protein 6.2 (*)    Alkaline Phosphatase 34 (*)    All other components within normal limits  ACETAMINOPHEN LEVEL - Abnormal; Notable for the following components:   Acetaminophen (Tylenol), Serum <10 (*)    All other components within normal limits  RAPID URINE DRUG SCREEN, HOSP PERFORMED - Abnormal; Notable for the following components:   Amphetamines POSITIVE (*)    Tetrahydrocannabinol POSITIVE (*)    All other components within normal limits  SALICYLATE LEVEL - Abnormal;  Notable for the following components:   Salicylate Lvl <7.0 (*)    All other components within normal limits  RESP PANEL BY RT-PCR (FLU A&B, COVID) ARPGX2  ETHANOL  CBC WITH DIFFERENTIAL/PLATELET    EKG None  Radiology No results found.  Procedures Procedures    Medications Ordered in ED Medications  ondansetron (ZOFRAN) tablet 4 mg (has no administration in time range)  alum & mag hydroxide-simeth (MAALOX/MYLANTA) 200-200-20 MG/5ML suspension 30 mL (has no administration in time range)  nicotine (NICODERM CQ - dosed in mg/24 hours) patch 21 mg (0 mg Transdermal Hold 04/13/22 0926)  ibuprofen (ADVIL) tablet 600 mg (has no administration in time range)    ED Course/ Medical Decision Making/ A&P                           Medical  Decision Making Amount and/or Complexity of Data Reviewed Labs: ordered.  Risk OTC drugs. Prescription drug management.   Patient has vague suicidal thoughts without plan for months. Labs obtained, overall unremarkable. No significant electrolyte abnormality. UDS positive for methamphetamines/THC. Soft BPs but no hypotension or medical complaints. Psychiatry has cleared for discharge. Given bus passes and shelter resources.         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

## 2023-03-11 IMAGING — CR DG CHEST 2V
2 series · 2 of 2 positions shown · non-contrast
Comparison: None.

CLINICAL DATA: 30-year-old male with sore throat and cough for 5
days.

EXAM:
CHEST - 2 VIEW

[w chest pa]
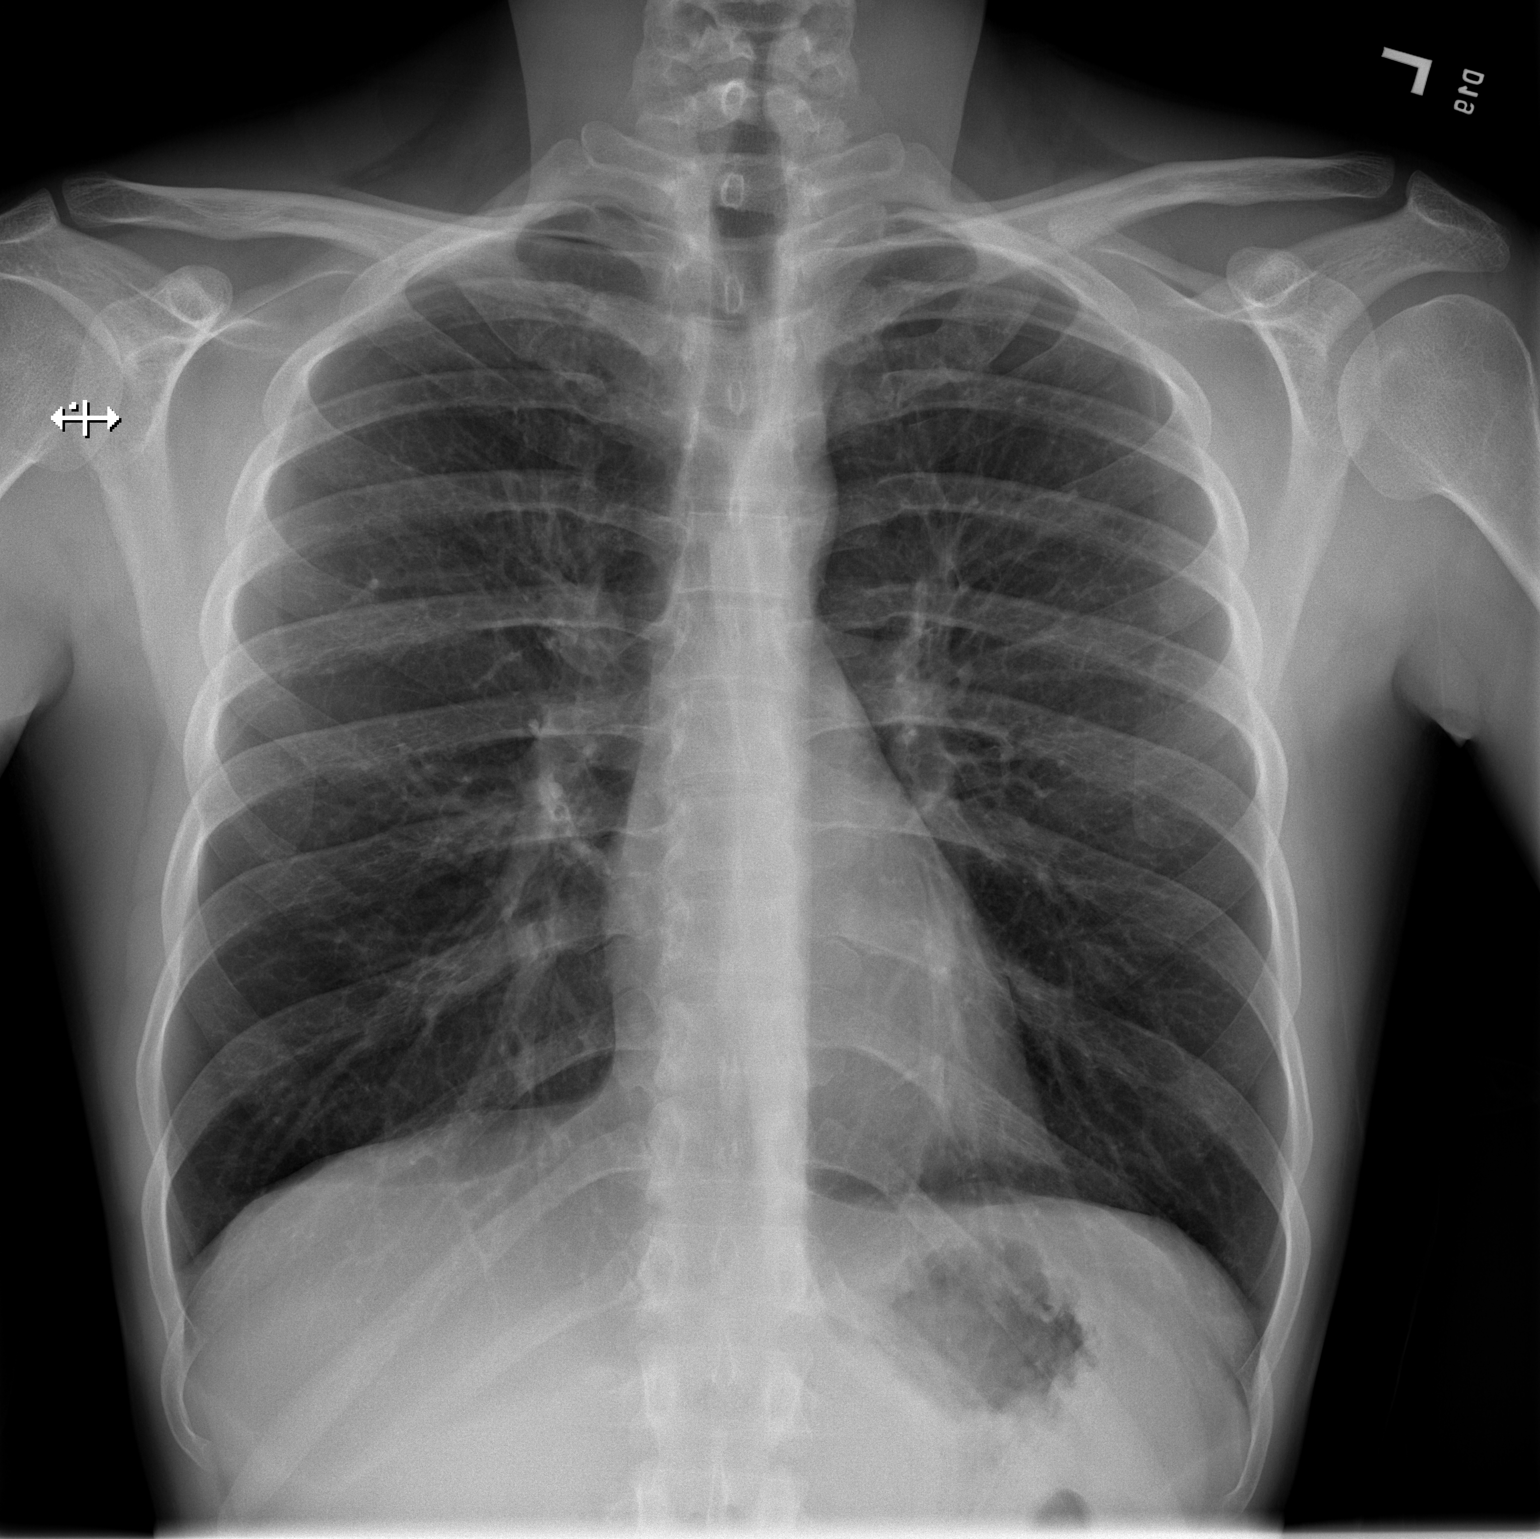

[w chest lat]
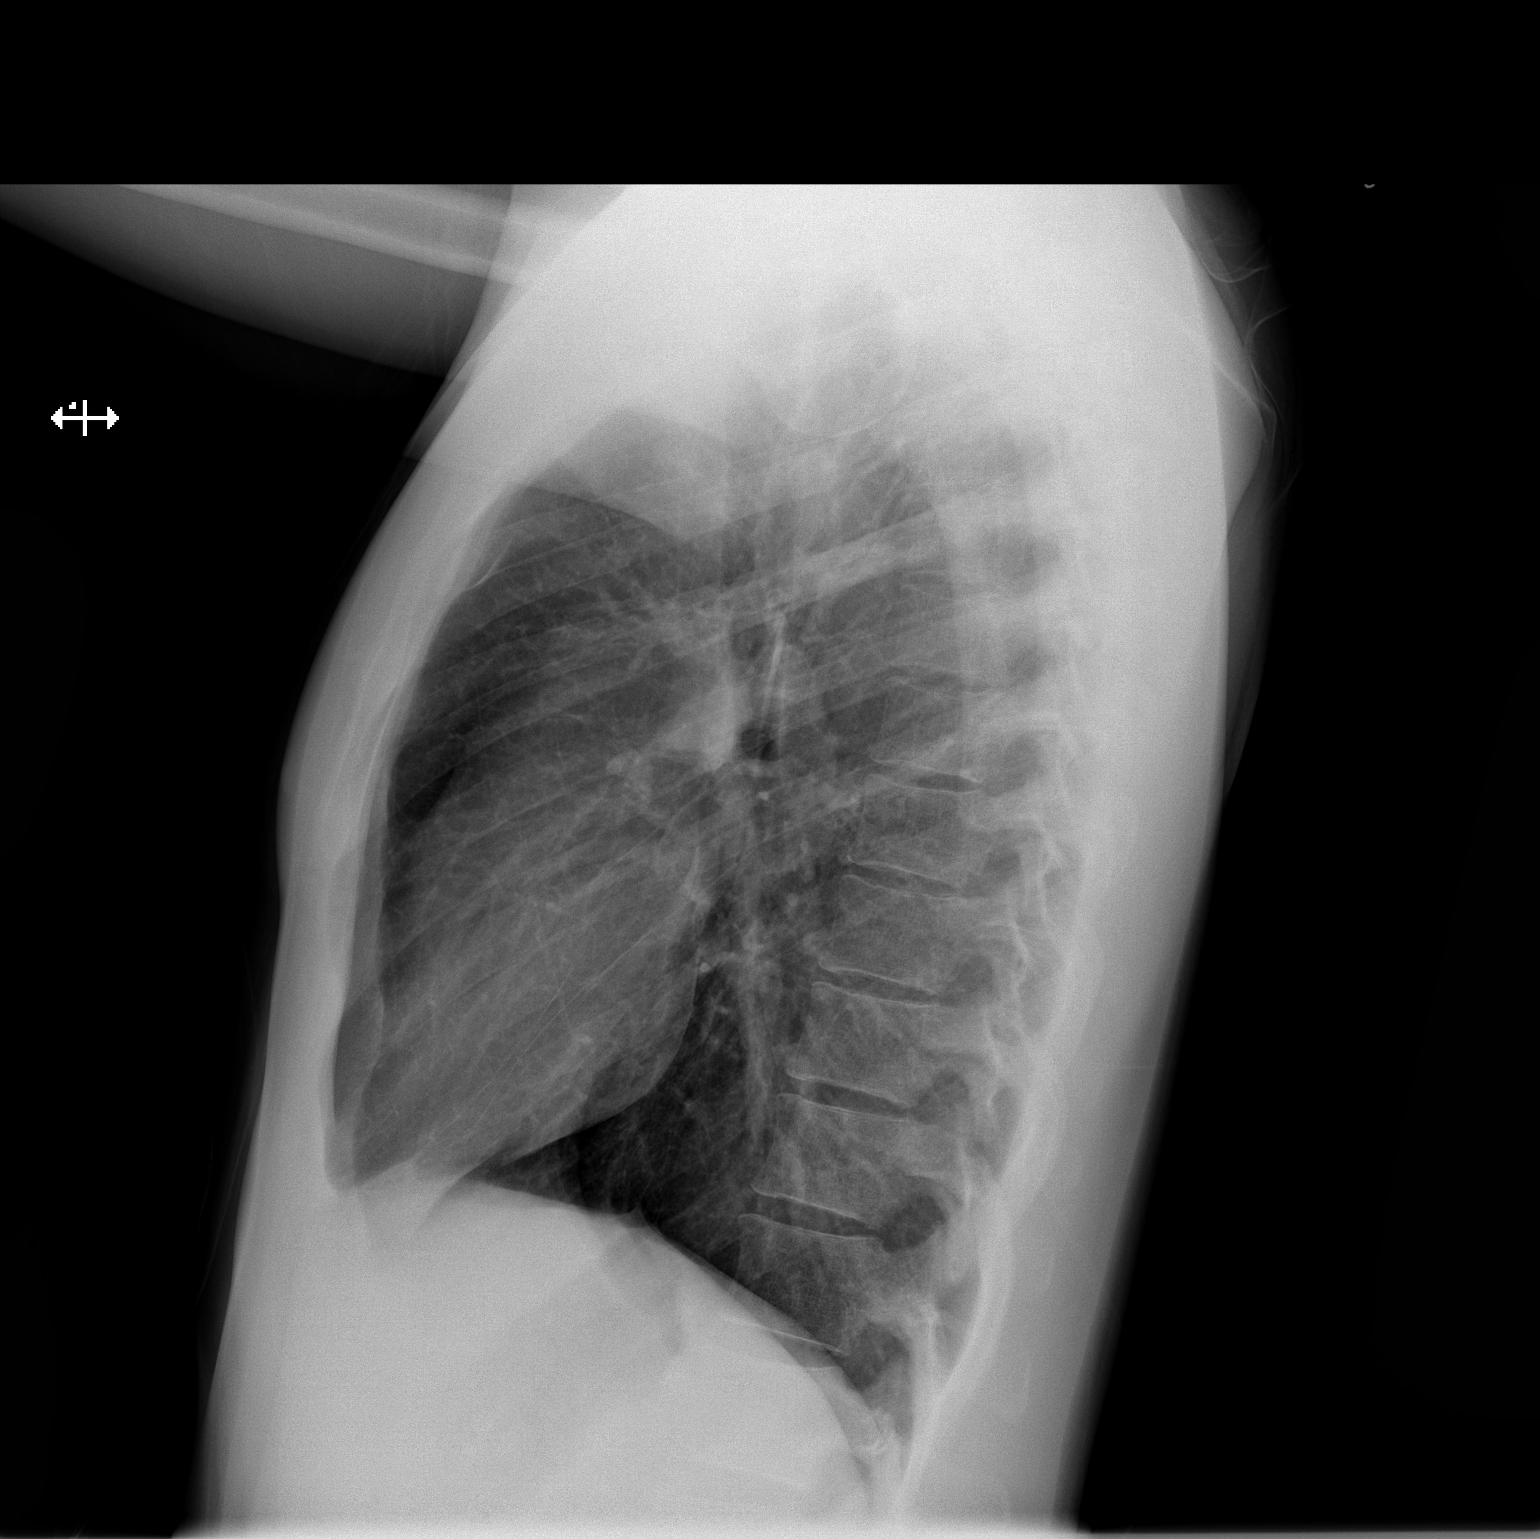

[2 of 2 positions shown; findings below may reference images not displayed]

FINDINGS: Lung volumes are at the upper limits of normal. Normal cardiac size
and mediastinal contours. Visualized tracheal air column is within
normal limits. Lung markings are symmetric and within normal limits.
Both lungs appear clear. No pneumothorax or pleural effusion.

No osseous abnormality identified.  Negative visible bowel gas.
IMPRESSION: Negative, no cardiopulmonary abnormality.
